# Patient Record
Sex: Male | Born: 1960 | Race: Black or African American | Hispanic: No | Marital: Single
Health system: Southern US, Community
[De-identification: ages and names within clinical notes are randomized; demographics above are authoritative.]

## PROBLEM LIST (undated history)

## (undated) DIAGNOSIS — R7989 Other specified abnormal findings of blood chemistry: Secondary | ICD-10-CM

## (undated) DIAGNOSIS — M542 Cervicalgia: Secondary | ICD-10-CM

## (undated) DIAGNOSIS — M25512 Pain in left shoulder: Secondary | ICD-10-CM

## (undated) DIAGNOSIS — D649 Anemia, unspecified: Secondary | ICD-10-CM

## (undated) DIAGNOSIS — Z720 Tobacco use: Secondary | ICD-10-CM

## (undated) DIAGNOSIS — E785 Hyperlipidemia, unspecified: Secondary | ICD-10-CM

## (undated) DIAGNOSIS — M5412 Radiculopathy, cervical region: Secondary | ICD-10-CM

## (undated) DIAGNOSIS — I10 Essential (primary) hypertension: Secondary | ICD-10-CM

## (undated) DIAGNOSIS — R609 Edema, unspecified: Secondary | ICD-10-CM

## (undated) DIAGNOSIS — F1090 Alcohol use, unspecified, uncomplicated: Secondary | ICD-10-CM

## (undated) HISTORY — DX: Anemia, unspecified: D64.9

## (undated) HISTORY — DX: Other specified abnormal findings of blood chemistry: R79.89

## (undated) HISTORY — DX: Tobacco use: Z72.0

## (undated) HISTORY — DX: Radiculopathy, cervical region: M54.12

## (undated) HISTORY — DX: Alcohol use, unspecified, uncomplicated: F10.90

## (undated) HISTORY — DX: Edema, unspecified: R60.9

## (undated) HISTORY — DX: Essential (primary) hypertension: I10

## (undated) HISTORY — DX: Pain in left shoulder: M25.512

## (undated) HISTORY — DX: Cervicalgia: M54.2

## (undated) HISTORY — DX: Hyperlipidemia, unspecified: E78.5

---

## 2003-09-07 ENCOUNTER — Ambulatory Visit (HOSPITAL_BASED_OUTPATIENT_CLINIC_OR_DEPARTMENT_OTHER): Admission: RE | Admit: 2003-09-07 | Discharge: 2003-09-07 | Payer: Self-pay | Admitting: Otolaryngology

## 2003-09-07 ENCOUNTER — Encounter (INDEPENDENT_AMBULATORY_CARE_PROVIDER_SITE_OTHER): Payer: Self-pay | Admitting: Specialist

## 2003-09-07 ENCOUNTER — Ambulatory Visit (HOSPITAL_COMMUNITY): Admission: RE | Admit: 2003-09-07 | Discharge: 2003-09-07 | Payer: Self-pay | Admitting: Otolaryngology

## 2005-01-18 ENCOUNTER — Encounter: Admission: RE | Admit: 2005-01-18 | Discharge: 2005-01-18 | Payer: Self-pay | Admitting: Occupational Medicine

## 2006-10-29 ENCOUNTER — Ambulatory Visit: Payer: Self-pay | Admitting: Cardiology

## 2006-11-09 ENCOUNTER — Ambulatory Visit: Payer: Self-pay | Admitting: Cardiology

## 2006-11-22 ENCOUNTER — Ambulatory Visit: Payer: Self-pay | Admitting: Cardiology

## 2006-12-17 ENCOUNTER — Ambulatory Visit: Payer: Self-pay | Admitting: Cardiology

## 2010-10-04 NOTE — Assessment & Plan Note (Signed)
[facility]                          [facility]   NAME:[name] [name], [name] [name]                 MRN:          [id number]  DATE:12/17/2006                            DOB:          13-Dec-1960    The patient was seen in consultation for Dr. Selinda Flavin on October 28, 2006. He had had a syncopal episode. He had been evaluated in [facility]. His 2-D echocardiogram had shown normal LV function with mild  LVH. We decided to proceed with an event recording. This study showed  some scattered PVCs but no high-grade arrhythmias. He also had a stress  Cardiolite scan. The patient exercised for 10 minutes. There were random  PVCs. There was no chest pain. There were no couplets or triplets. The  images revealed that there was no significant scar or ischemia. There  was question that the left ventricular size may be slightly increased.  The ejection fraction was 50%. However, we know by report previously  that his echocardiogram showed no marked abnormalities.   The patient is seen back today, and he is stable. He has not had any  recurrent spells. He seems to be doing well.   His blood pressure today is 130/87 with a pulse of 67. I discussed the  results with the patient. I believe his cardiac status is stable. I  encouraged him to be sure that he tried not to use any drugs including  tobacco, alcohol or cannabinoids.   PROBLEMS INCLUDE:  1. History of syncope. Etiology is not clear, but further workup is      not needed.  2. Mild hypertension. Stable.  3. Mild left ventricular hypertrophy.  4. History of polysubstance abuse.  5. Palpitations from mild premature ventricular contractions.  6. Some chest discomfort but no proven ischemia.   It is of note that we did start low-dose aspirin that I would continue.  It was mentioned at the time of my evaluation of checking his TSH and  lipids. I cannot find any records that these labs were ever  checked. I  will pass this information on to Dr. Dimas Aguas. Dr. Dimas Aguas may have some of  this information. No further followup is needed, but I will be happy to  see the patient if needed.     Luis Abed, MD, [facility]  Electronically Signed    JDK/MedQ  DD: 12/17/2006  DT: 12/18/2006  Job #: [id number]   cc:   Selinda Flavin

## 2010-10-04 NOTE — Assessment & Plan Note (Signed)
[facility]                          [facility]   NAME:[name] [name] WAAGE                 MRN:          [id number]  DATE:10/28/2006                            DOB:          07-15-1960    REASON FOR CONSULTATION:  [name] [name] is a 50 year old male with no prior  cardiac history, now referred to Dr. Willa Rough for evaluation of  recent syncope.   The patient presents on no medications and denies any documented history  of hypertension.  However, he has had documented elevated blood pressure  readings which, in fact, were as high as 164/97 during recent  presentation to the emergency room.  With respect to other cardiac risk  factors, he does admit to smoking cigars but not cigarettes, does not  know his lipid status, and denies any history of diabetes mellitus or a  family history of coronary artery disease.   The patient recently presented to the [facility] in [place], [place], with complaint of recurrent syncope.  In fact, the episodes apparently were only 15 minutes apart and it was  following the second episode that the patient became quite concerned and  went to the emergency room.  He had a negative workup, including a CT  scan of the head, but did have a urine drug screen positive for  cannabinoids.  A 2D echocardiogram showed normal left ventricular  function with mild concentric LVH and mild mitral regurgitation.   The patient was discharged with recommendation to be placed on a 48-hour  Holter monitor (records currently unavailable).  It is not clear if he  also had a TSH level.   The patient recalls having a sudden syncopal episode with no prodromal  symptoms.  This occurred while standing outside on a day where it was  not very hot, and the patient had not been drinking beer excessively.  In fact, he states that he had had only 1 beer earlier that day.  Following the second episode,  however, which was preceded by a sensation  of mild nausea and diaphoresis, he became quite concerned and sought  medical attention.  In neither case, however, did he have any chest pain  or tachy palpitations.  It is also not clear if he completely passed  out; the patient reports to me today that he caught himself prior to  hitting the ground.   Since this episode, the patient has not had any recurrent near syncope,  but has become aware of occasional tachy palpitations.  He also  describes a sensation of chest discomfort which seems to occur with  exertion, but also can occur at rest.  He denies any symptoms of reflux  disease.   Recent EKG reveals normal sinus rhythm at 76 BPM with normal axis,  occasional PVCs and LVH by voltage criteria.   ALLERGIES:  No known drug allergies.   MEDICATIONS:  None.   PAST MEDICAL HISTORY:  Nasal polyp surgery.   FAMILY HISTORY:  Negative for coronary artery disease.   SOCIAL HISTORY:  The patient is divorced, has 4 children and 1  grandchild.  He smokes 4-5 cigars a day, but does not smoke cigarettes.  He admits to drinking 1-2 beers a day and having 1-2 shots of hard  liquor a day as well.  However, he denies history of excessive or  abusive drinking.   REVIEW OF SYSTEMS:  Denies history of myocardial infarction, congestive  heart failure, hypertension, diabetes mellitus, or hyperlipidemia.  Otherwise as per HPI, remaining systems negative.   PHYSICAL EXAMINATION:  Blood pressure 148/91, pulse 73, regular, weight  173.  IN GENERAL:  A 50 year old male, sitting upright, no distress.  HEENT:  Normocephalic, atraumatic.  NECK:  Palpable bilateral carotid pulses without bruits.  No JVD.  LUNGS:  Clear to auscultation all fields.  HEART:  Regular rate and rhythm (S1, S2).  No murmurs, rubs, or gallops.  ABDOMEN:  Soft, nontender, with intact bowel sounds.  EXTREMITIES:  Palpable distal pulses without significant edema.  NEURO:  No focal  deficit.   IMPRESSION:  1. Recurrent near syncope.      a.     Normal left ventricular function by 2D echo.  2. Hypertension.      a.     Mild, concentric left ventricular hypertrophy by 2D echo.  3. Polysubstance abuse.      a.     Tobacco, alcohol, and cannabinoids.  4. Palpitations.      a.     Documented premature ventricular contractions.  5. Chest discomfort.      a.     Typical/atypical features.   PLAN:  1. Schedule exercise stress Cardiolite for risk stratification.  If      this is suggestive of ischemia, then plan is to consider further      workup with a diagnostic coronary angiogram.  2. Place the patient on a CardioNet monitor for 3 weeks to rule out      transient arrhythmias.  3. Check fasting lipids/liver profile and TSH level.  4. Start low-dose aspirin for primary prevention.  5. Schedule return clinic followup with myself and Dr. Willa Rough in      approximately 4 weeks for review of study results and further      recommendations.      [name] Searing, PA-C  Electronically Signed      [name] Abed, MD, [facility]  Electronically Signed   GS/MedQ  DD: 10/29/2006  DT: 10/29/2006  Job #: [id number]   cc:   [name] Flavin, MD

## 2010-10-07 NOTE — Op Note (Signed)
NAME:  [name] [name], [name] [name]                            ACCOUNT NO.:  [id number]   MEDICAL RECORD NO.:  [id number]                   PATIENT TYPE:  AMB   LOCATION:  [facility]                                  FACILITY:  [facility]   PHYSICIAN:  Jefry H. Pollyann Kennedy, M.D.                DATE OF BIRTH:  03-11-1961   DATE OF PROCEDURE:  09/07/2003  DATE OF DISCHARGE:                                 OPERATIVE REPORT   PREOPERATIVE DIAGNOSIS:  1. Nasal polyposis.  2. Sinus polyposis.  3. Chronic ethmoid sinusitis.  4. Chronic maxillary sinusitis.   POSTOPERATIVE DIAGNOSIS:  1. Nasal polyposis.  2. Sinus polyposis.  3. Chronic ethmoid sinusitis.  4. Chronic maxillary sinusitis.   PROCEDURE:  1. Left extensive endoscopic nasal polypectomy.  2. Left endoscopic total ethmoidectomy.  3. Left endoscopic maxillary sinusitis with removal of abnormal tissue from     maxillary sinus.   SURGEON:  Jefry H. Pollyann Kennedy, M.D.   ANESTHESIA:  General endotracheal anesthesia.   COMPLICATIONS:  None.   FINDINGS:  Large polypoid mass filling the left nasal cavity including the  choanae.  Extensive polypoid disease filling the left maxillary sinus that  was completely obstructed.  Some purulent material also within the left  maxillary sinus.  Chronic ethmoid sinus disease with inflamed and polypoid  degeneration of the mucosa.  Very firm, dense bone in the ethmoid and the  posterior medial turbinate attachment.   HISTORY:  This is a 50 year old gentleman with a multi-year history of  difficulty breathing through the left side of his nose.  Radiographic  imaging revealed extensive soft tissue mass with some bony remodeling in the  left nasal cavity and the left maxillary sinus as well as the infundibulum.  He did not respond to medical therapy.  The risks, benefits, alternatives,  and complications of the procedure were explained to the patient and he  seemed to understand and agreed to the surgery.   PROCEDURE:  The patient  was taken to the operating room and placed on the  operating table in supine position.  Following induction of general  endotracheal anesthesia, the patient was prepped and draped in a standard  fashion.  Oxymetazoline spray was used preoperatively in the nasal cavities.  1% Xylocaine with epinephrine was infiltrated into the polypoid mass and  eventually into the attachments of the middle turbinates.  Afrin soaked  pledgets were used periodically during the procedure.   1. Extensive left endoscopic nasal polypectomy.  The 0 degree nasal     endoscope and the microdebrider were used to carefully removed the     extensive polypoid disease that was present within the nasal cavities to     open up the nasal cavity to be able to visualize the middle turbinate and     other structures that are important for sinus surgery.   1. Left endoscopic total ethmoidectomy.  After the  nasal polypectomy was     performed, the dissection was continued into the ethmoid cavity.  The     uncinate process was severely lateralized and was resected using through     cut forceps.  The bullae was obliterated by polypoid tissue and a     complete ethmoidectomy dissection was accomplished.  The roof of the     ethmoid was severely, dense hard bone and attempts were not made to open     into any superior cells which actually were clear on the imaging studies.     The lateral limit of dissection was the lamina papyracea which was kept     intact.  The posterior attachment of the middle turbinate was kept intact     except for inferiorly where it was much thinner and removed and this     allowed access into the posterior cells.   1. Left endoscopic maxillary antrostomy.  The natural ostium of the     maxillary sinus was identified with the suction and 30 degree scope.     Purulent secretions and a large mass of polypoid tissue was found within     the sinus.  Some separate biopsies were taken from the schneiderian      region and were sent for frozen section evaluation which revealed no     definite evidence of inverting papilloma.  Polypoid dense fibrotic tissue     was removed from the maxillary sinus using upbiting and giraffe forceps.     The majority of the abnormal tissue was removed.  The antrostomy was     opened wide using back biting forceps and through cut forceps.  The     maxillary sinus was packed with Cortisporin ointment.  The bipolar     cautery was used to provide hemostasis in a branch of the sphenoethmoid     artery posteriorly.  The cavities were suctioned of blood, secretions,     and abnormal tissue.  A Kennedy pack was used to pack the ethmoid sinus     and a rolled Telfa packing was placed in the left nasal cavity.  The     pharynx was suctioned of blood and secretions.  The patient was then     awakened, extubated, and transferred to the recovery room.                                               Jefry H. Pollyann Kennedy, M.D.    JHR/MEDQ  D:  09/07/2003  T:  09/07/2003  Job:  [id number]

## 2012-03-13 ENCOUNTER — Other Ambulatory Visit: Payer: Self-pay | Admitting: Family Medicine

## 2012-03-13 ENCOUNTER — Ambulatory Visit
Admission: RE | Admit: 2012-03-13 | Discharge: 2012-03-13 | Disposition: A | Discharge status: Home / Self Care | Payer: No Typology Code available for payment source | Source: Ambulatory Visit | Attending: Family Medicine | Admitting: Family Medicine

## 2012-03-13 DIAGNOSIS — Z0000 Encounter for general adult medical examination without abnormal findings: Secondary | ICD-10-CM

## 2018-06-20 DIAGNOSIS — R5383 Other fatigue: Secondary | ICD-10-CM | POA: Diagnosis not present

## 2018-06-20 DIAGNOSIS — R030 Elevated blood-pressure reading, without diagnosis of hypertension: Secondary | ICD-10-CM | POA: Diagnosis not present

## 2018-06-20 DIAGNOSIS — Z13220 Encounter for screening for lipoid disorders: Secondary | ICD-10-CM | POA: Diagnosis not present

## 2018-06-20 DIAGNOSIS — I10 Essential (primary) hypertension: Secondary | ICD-10-CM | POA: Diagnosis not present

## 2018-06-25 DIAGNOSIS — Z6821 Body mass index (BMI) 21.0-21.9, adult: Secondary | ICD-10-CM | POA: Diagnosis not present

## 2018-06-25 DIAGNOSIS — Z0000 Encounter for general adult medical examination without abnormal findings: Secondary | ICD-10-CM | POA: Diagnosis not present

## 2018-08-07 DIAGNOSIS — Z1211 Encounter for screening for malignant neoplasm of colon: Secondary | ICD-10-CM | POA: Diagnosis not present

## 2018-08-07 DIAGNOSIS — Z6822 Body mass index (BMI) 22.0-22.9, adult: Secondary | ICD-10-CM | POA: Diagnosis not present

## 2018-10-01 DIAGNOSIS — Z1159 Encounter for screening for other viral diseases: Secondary | ICD-10-CM | POA: Diagnosis not present

## 2018-10-03 DIAGNOSIS — F17290 Nicotine dependence, other tobacco product, uncomplicated: Secondary | ICD-10-CM | POA: Diagnosis not present

## 2018-10-03 DIAGNOSIS — K635 Polyp of colon: Secondary | ICD-10-CM | POA: Diagnosis not present

## 2018-10-03 DIAGNOSIS — I10 Essential (primary) hypertension: Secondary | ICD-10-CM | POA: Diagnosis not present

## 2018-10-03 DIAGNOSIS — Z1211 Encounter for screening for malignant neoplasm of colon: Secondary | ICD-10-CM | POA: Diagnosis not present

## 2018-10-03 DIAGNOSIS — D128 Benign neoplasm of rectum: Secondary | ICD-10-CM | POA: Diagnosis not present

## 2018-11-18 DIAGNOSIS — D126 Benign neoplasm of colon, unspecified: Secondary | ICD-10-CM | POA: Diagnosis not present

## 2019-01-29 DIAGNOSIS — M7702 Medial epicondylitis, left elbow: Secondary | ICD-10-CM | POA: Diagnosis not present

## 2019-01-29 DIAGNOSIS — I10 Essential (primary) hypertension: Secondary | ICD-10-CM | POA: Diagnosis not present

## 2019-01-29 DIAGNOSIS — Z6821 Body mass index (BMI) 21.0-21.9, adult: Secondary | ICD-10-CM | POA: Diagnosis not present

## 2019-02-12 DIAGNOSIS — I10 Essential (primary) hypertension: Secondary | ICD-10-CM | POA: Diagnosis not present

## 2019-02-12 DIAGNOSIS — Z6821 Body mass index (BMI) 21.0-21.9, adult: Secondary | ICD-10-CM | POA: Diagnosis not present

## 2019-05-14 DIAGNOSIS — Z20828 Contact with and (suspected) exposure to other viral communicable diseases: Secondary | ICD-10-CM | POA: Diagnosis not present

## 2019-05-19 DIAGNOSIS — Z20828 Contact with and (suspected) exposure to other viral communicable diseases: Secondary | ICD-10-CM | POA: Diagnosis not present

## 2019-05-30 DIAGNOSIS — Z6821 Body mass index (BMI) 21.0-21.9, adult: Secondary | ICD-10-CM | POA: Diagnosis not present

## 2019-05-30 DIAGNOSIS — I10 Essential (primary) hypertension: Secondary | ICD-10-CM | POA: Diagnosis not present

## 2019-05-30 DIAGNOSIS — M25512 Pain in left shoulder: Secondary | ICD-10-CM | POA: Diagnosis not present

## 2019-07-01 DIAGNOSIS — M25512 Pain in left shoulder: Secondary | ICD-10-CM | POA: Diagnosis not present

## 2019-07-04 DIAGNOSIS — M25512 Pain in left shoulder: Secondary | ICD-10-CM | POA: Diagnosis not present

## 2019-07-11 DIAGNOSIS — M25512 Pain in left shoulder: Secondary | ICD-10-CM | POA: Diagnosis not present

## 2019-07-15 DIAGNOSIS — M25512 Pain in left shoulder: Secondary | ICD-10-CM | POA: Diagnosis not present

## 2019-07-15 DIAGNOSIS — Z6821 Body mass index (BMI) 21.0-21.9, adult: Secondary | ICD-10-CM | POA: Diagnosis not present

## 2019-07-22 DIAGNOSIS — M7582 Other shoulder lesions, left shoulder: Secondary | ICD-10-CM | POA: Diagnosis not present

## 2019-07-22 DIAGNOSIS — M25512 Pain in left shoulder: Secondary | ICD-10-CM | POA: Diagnosis not present

## 2019-08-01 DIAGNOSIS — M25512 Pain in left shoulder: Secondary | ICD-10-CM | POA: Diagnosis not present

## 2019-08-19 ENCOUNTER — Ambulatory Visit: Payer: Self-pay | Attending: Internal Medicine

## 2019-08-19 DIAGNOSIS — Z23 Encounter for immunization: Secondary | ICD-10-CM

## 2019-08-19 NOTE — Progress Notes (Signed)
   Covid-19 Vaccination Clinic  Name:  Anothy Bufano    MRN: [id number] DOB: 11-Dec-1960  08/19/2019  Mr. Goates was observed post Covid-19 immunization for 15 minutes without incident. He was provided with Vaccine Information Sheet and instruction to access the V-Safe system.   Mr. Phegley was instructed to call 911 with any severe reactions post vaccine: Marland Kitchen Difficulty breathing  . Swelling of face and throat  . A fast heartbeat  . A bad rash all over body  . Dizziness and weakness   Immunizations Administered    Name Date Dose VIS Date Route   Moderna COVID-19 Vaccine 08/19/2019  2:00 PM 0.5 mL 04/22/2019 Intramuscular   Manufacturer: Moderna   Lot: [id number]   NDC: 27078-675-44

## 2019-09-09 ENCOUNTER — Other Ambulatory Visit: Payer: Self-pay

## 2019-09-09 ENCOUNTER — Ambulatory Visit (INDEPENDENT_AMBULATORY_CARE_PROVIDER_SITE_OTHER): Payer: BC Managed Care – PPO | Admitting: Neurology

## 2019-09-09 DIAGNOSIS — M5412 Radiculopathy, cervical region: Secondary | ICD-10-CM

## 2019-09-09 DIAGNOSIS — R202 Paresthesia of skin: Secondary | ICD-10-CM

## 2019-09-09 DIAGNOSIS — G5602 Carpal tunnel syndrome, left upper limb: Secondary | ICD-10-CM

## 2019-09-09 NOTE — Procedures (Signed)
[facility]  [address], [address]  [place], [place] [address] Tel: [phone] Fax:  [phone] Test Date:  09/09/2019  Patient: [name] [name] DOB: Dec 06, 1960 Physician: Nita Sickle, DO  Sex: Male Height: 5\' 11"  Ref Phys: , MD  ID#: Burnell Blanks Temp: 35.0C Technician:    Patient Complaints: This is a 59 year-old man referred for evaluation of left arm paresthesias.  NCV & EMG Findings: Extensive electrodiagnostic testing of the left upper extremity shows: 1. Left median sensory response shows prolonged latency (L4.2 ms).  Left ulnar and radial sensory responses are with normal limits. 2. Left median and ulnar motor responses are within normal limits. 3. Chronic motor axon loss changes are seen in the left biceps, deltoid, and infraspinatus muscles with active fibrillation potentials.   Impression: 1. 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 2. Left median neuropathy at or distal to the wrist (mild), consistent with a clinical diagnosis of carpal tunnel syndrome.     ___________________________ 46, DO    Nerve Conduction Studies Anti Sensory Summary Table   Stim Site NR Peak (ms) Norm Peak (ms) P-T Amp (V) Norm P-T Amp  Left Median Anti Sensory (2nd Digit)  35C  Wrist    4.2 <3.6 21.7 >15  Left Radial Anti Sensory (Base 1st Digit)  33C  Wrist    2.6 <2.7 26.7 >14  Left Ulnar Anti Sensory (5th Digit)  33C  Wrist    3.1 <3.1 12.2 >10   Motor Summary Table   Stim Site NR Onset (ms) Norm Onset (ms) O-P Amp (mV) Norm O-P Amp Site1 Site2 Delta-0 (ms) Dist (cm) Vel (m/s) Norm Vel (m/s)  Left Median Motor (Abd Poll Brev)  33C  Wrist    3.8 <4.0 6.6 >6 Elbow Wrist 5.6 32.0 57 >50  Elbow    9.4  6.1         Left Ulnar Motor (Abd Dig Minimi)  33C  Wrist    2.4 <3.1 12.7 >7 B Elbow Wrist 4.9 28.0 57 >50  B Elbow    7.3  11.3  A Elbow B Elbow 1.8 10.0 56 >50  A Elbow    9.1  10.4           EMG   Side Muscle Ins Act Fibs Psw Fasc Number Recrt Dur Dur. Amp Amp. Poly Poly. Comment  Left 1stDorInt Nml Nml Nml Nml Nml Nml Nml Nml Nml Nml Nml Nml N/A  Left Abd Poll Brev Nml Nml Nml Nml Nml Nml Nml Nml Nml Nml Nml Nml N/A  Left PronatorTeres Nml Nml Nml Nml Nml Nml Nml Nml Nml Nml Nml Nml N/A  Left Biceps Nml 2+ Nml Nml 3- Rapid Most 1+ Most 1+ Most 1+ ATR  Left Triceps Nml Nml Nml Nml Nml Nml Nml Nml Nml Nml Nml Nml N/A  Left Deltoid Nml 2+ Nml Nml SMU Rapid Most 1+ Most 1+ Most 1+ ATR  Left Cervical Parasp Low Nml Nml Nml Nml Nml Nml Nml Nml Nml Nml Nml Nml N/A  Left Infraspinatus Nml 2+ Nml Nml SMU Rapid All 1+ All 1+ All 1+ ATR      Waveforms:

## 2019-09-19 DIAGNOSIS — M25512 Pain in left shoulder: Secondary | ICD-10-CM | POA: Diagnosis not present

## 2019-10-02 ENCOUNTER — Ambulatory Visit: Payer: BC Managed Care – PPO | Attending: Internal Medicine

## 2019-10-02 DIAGNOSIS — Z23 Encounter for immunization: Secondary | ICD-10-CM

## 2019-10-02 NOTE — Progress Notes (Signed)
   Covid-19 Vaccination Clinic  Name:  [name] [name]    MRN: [id number] DOB: [birth year]  10/02/2019  [name] was observed post Covid-19 immunization for 15 minutes without incident. He was provided with Vaccine Information Sheet and instruction to access the V-Safe system.   [name] was instructed to call 911 with any severe reactions post vaccine: Marland Kitchen Difficulty breathing  . Swelling of face and throat  . A fast heartbeat  . A bad rash all over body  . Dizziness and weakness   Immunizations Administered    Name Date Dose VIS Date Route   Moderna COVID-19 Vaccine 10/02/2019  9:41 AM 0.5 mL 04/2019 Intramuscular   Manufacturer: Moderna   Lot: [id number]   NDC: 89842-103-12

## 2019-10-03 DIAGNOSIS — M4802 Spinal stenosis, cervical region: Secondary | ICD-10-CM | POA: Diagnosis not present

## 2019-10-03 DIAGNOSIS — M40202 Unspecified kyphosis, cervical region: Secondary | ICD-10-CM | POA: Diagnosis not present

## 2019-10-03 DIAGNOSIS — M47812 Spondylosis without myelopathy or radiculopathy, cervical region: Secondary | ICD-10-CM | POA: Diagnosis not present

## 2019-10-03 DIAGNOSIS — M542 Cervicalgia: Secondary | ICD-10-CM | POA: Diagnosis not present

## 2019-10-10 DIAGNOSIS — M25512 Pain in left shoulder: Secondary | ICD-10-CM | POA: Diagnosis not present

## 2019-11-12 DIAGNOSIS — M7552 Bursitis of left shoulder: Secondary | ICD-10-CM | POA: Diagnosis not present

## 2019-11-12 DIAGNOSIS — G8918 Other acute postprocedural pain: Secondary | ICD-10-CM | POA: Diagnosis not present

## 2019-11-12 DIAGNOSIS — S46002A Unspecified injury of muscle(s) and tendon(s) of the rotator cuff of left shoulder, initial encounter: Secondary | ICD-10-CM | POA: Diagnosis not present

## 2019-11-12 DIAGNOSIS — M7542 Impingement syndrome of left shoulder: Secondary | ICD-10-CM | POA: Diagnosis not present

## 2019-11-12 DIAGNOSIS — M24112 Other articular cartilage disorders, left shoulder: Secondary | ICD-10-CM | POA: Diagnosis not present

## 2019-11-12 DIAGNOSIS — M19012 Primary osteoarthritis, left shoulder: Secondary | ICD-10-CM | POA: Diagnosis not present

## 2019-11-12 DIAGNOSIS — G5602 Carpal tunnel syndrome, left upper limb: Secondary | ICD-10-CM | POA: Diagnosis not present

## 2019-11-28 DIAGNOSIS — M25512 Pain in left shoulder: Secondary | ICD-10-CM | POA: Diagnosis not present

## 2019-12-01 DIAGNOSIS — M25712 Osteophyte, left shoulder: Secondary | ICD-10-CM | POA: Diagnosis not present

## 2019-12-04 DIAGNOSIS — M25712 Osteophyte, left shoulder: Secondary | ICD-10-CM | POA: Diagnosis not present

## 2019-12-08 DIAGNOSIS — M25712 Osteophyte, left shoulder: Secondary | ICD-10-CM | POA: Diagnosis not present

## 2019-12-11 DIAGNOSIS — M25712 Osteophyte, left shoulder: Secondary | ICD-10-CM | POA: Diagnosis not present

## 2019-12-16 DIAGNOSIS — M25712 Osteophyte, left shoulder: Secondary | ICD-10-CM | POA: Diagnosis not present

## 2019-12-19 DIAGNOSIS — M25712 Osteophyte, left shoulder: Secondary | ICD-10-CM | POA: Diagnosis not present

## 2019-12-23 DIAGNOSIS — M25712 Osteophyte, left shoulder: Secondary | ICD-10-CM | POA: Diagnosis not present

## 2019-12-25 DIAGNOSIS — M25712 Osteophyte, left shoulder: Secondary | ICD-10-CM | POA: Diagnosis not present

## 2019-12-30 DIAGNOSIS — M25712 Osteophyte, left shoulder: Secondary | ICD-10-CM | POA: Diagnosis not present

## 2020-01-01 DIAGNOSIS — M25712 Osteophyte, left shoulder: Secondary | ICD-10-CM | POA: Diagnosis not present

## 2020-01-07 DIAGNOSIS — M25712 Osteophyte, left shoulder: Secondary | ICD-10-CM | POA: Diagnosis not present

## 2020-01-09 DIAGNOSIS — M25712 Osteophyte, left shoulder: Secondary | ICD-10-CM | POA: Diagnosis not present

## 2020-01-12 DIAGNOSIS — M25712 Osteophyte, left shoulder: Secondary | ICD-10-CM | POA: Diagnosis not present

## 2020-01-14 DIAGNOSIS — M25712 Osteophyte, left shoulder: Secondary | ICD-10-CM | POA: Diagnosis not present

## 2020-01-19 DIAGNOSIS — M25712 Osteophyte, left shoulder: Secondary | ICD-10-CM | POA: Diagnosis not present

## 2020-01-22 DIAGNOSIS — M25712 Osteophyte, left shoulder: Secondary | ICD-10-CM | POA: Diagnosis not present

## 2020-01-27 DIAGNOSIS — M25712 Osteophyte, left shoulder: Secondary | ICD-10-CM | POA: Diagnosis not present

## 2020-01-29 DIAGNOSIS — M25712 Osteophyte, left shoulder: Secondary | ICD-10-CM | POA: Diagnosis not present

## 2020-02-03 DIAGNOSIS — M25712 Osteophyte, left shoulder: Secondary | ICD-10-CM | POA: Diagnosis not present

## 2020-02-05 DIAGNOSIS — M25712 Osteophyte, left shoulder: Secondary | ICD-10-CM | POA: Diagnosis not present

## 2020-02-10 DIAGNOSIS — M25712 Osteophyte, left shoulder: Secondary | ICD-10-CM | POA: Diagnosis not present

## 2020-02-11 DIAGNOSIS — M25712 Osteophyte, left shoulder: Secondary | ICD-10-CM | POA: Diagnosis not present

## 2021-09-20 ENCOUNTER — Other Ambulatory Visit: Payer: Self-pay | Admitting: Orthopedic Surgery

## 2021-09-20 ENCOUNTER — Other Ambulatory Visit (HOSPITAL_COMMUNITY): Payer: Self-pay | Admitting: Orthopedic Surgery

## 2021-09-20 DIAGNOSIS — M25512 Pain in left shoulder: Secondary | ICD-10-CM

## 2021-10-05 ENCOUNTER — Ambulatory Visit (HOSPITAL_COMMUNITY)
Admission: RE | Admit: 2021-10-05 | Discharge: 2021-10-05 | Disposition: A | Discharge status: Home / Self Care | Payer: BC Managed Care – PPO | Source: Ambulatory Visit | Attending: Orthopedic Surgery | Admitting: Orthopedic Surgery

## 2021-10-05 DIAGNOSIS — M25512 Pain in left shoulder: Secondary | ICD-10-CM | POA: Diagnosis present

## 2021-12-20 ENCOUNTER — Other Ambulatory Visit: Payer: Self-pay | Admitting: Neurosurgery

## 2022-01-05 ENCOUNTER — Other Ambulatory Visit (HOSPITAL_COMMUNITY): Payer: BC Managed Care – PPO

## 2022-01-12 ENCOUNTER — Ambulatory Visit: Admit: 2022-01-12 | Payer: BC Managed Care – PPO

## 2022-01-12 SURGERY — ANTERIOR CERVICAL DECOMPRESSION/DISCECTOMY FUSION 4 LEVELS
Anesthesia: General

## 2023-03-18 IMAGING — MR MR SHOULDER*L* W/O CM
6 series · 40 of 40 positions shown · non-contrast
Comparison: None Available.

CLINICAL DATA: Left shoulder pain.

EXAM:
MRI OF THE LEFT SHOULDER WITHOUT CONTRAST
TECHNIQUE: Multiplanar, multisequence MR imaging of the shoulder was performed.
No intravenous contrast was administered.

[Series 5: T2 fat-sat · axial · left · 4.0mm · 0.47mm/px · z∈[-96,+22]mm · 8 of 26 slices shown (1 of 4)]
[im 1/26]
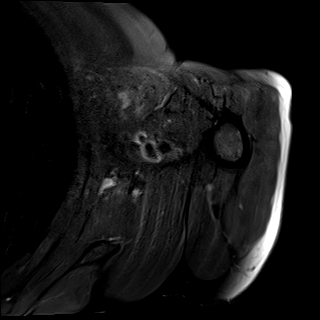
[im 4/26]
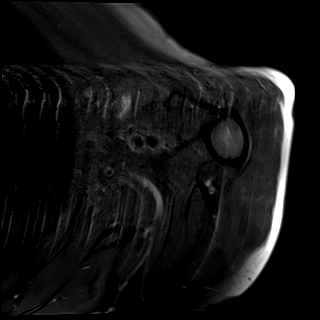
[im 8/26]
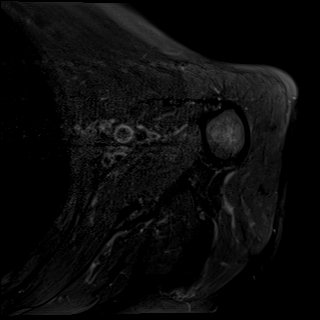
[im 11/26]
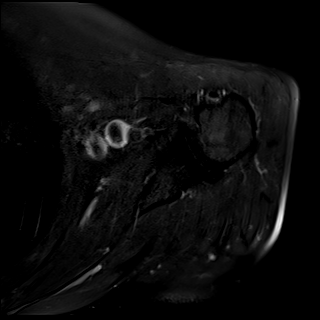
[im 15/26]
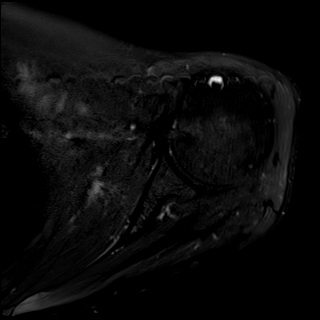
[im 18/26]
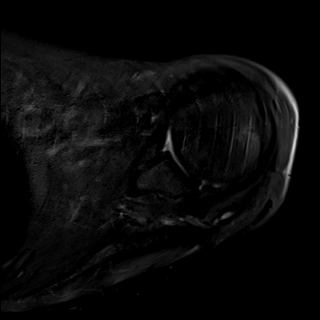
[im 22/26]
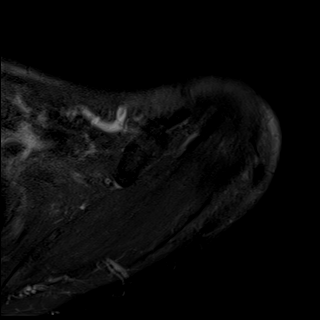
[im 26/26]
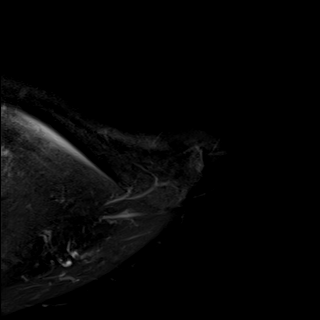

[Series 6: T2 fat-sat · oblique · left · 4.0mm · 0.47mm/px · 7 of 26 slices shown (2 of 4)]
[im 1/26]
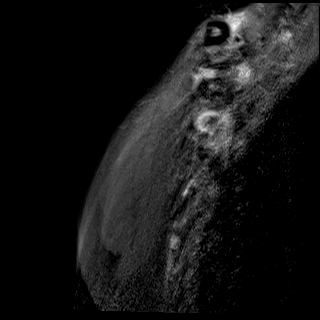
[im 5/26]
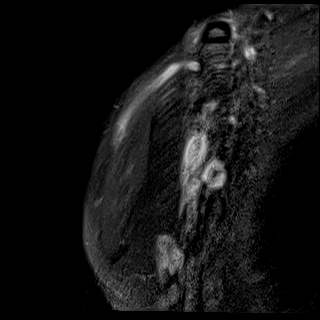
[im 9/26]
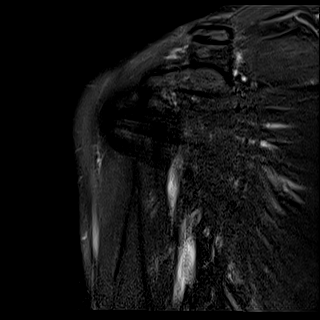
[im 13/26]
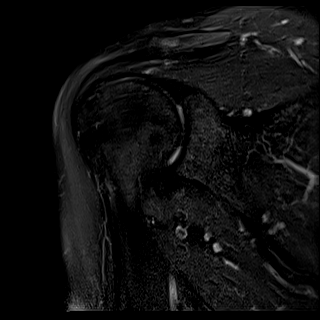
[im 17/26]
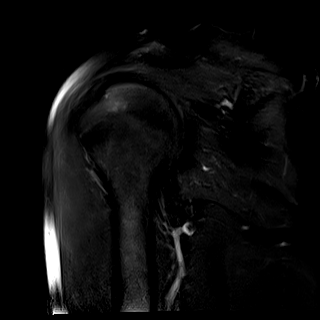
[im 21/26]
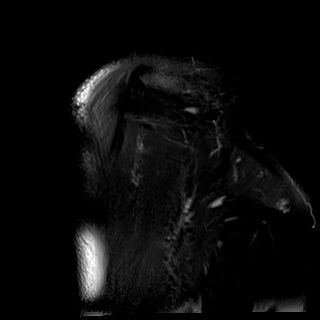
[im 26/26]
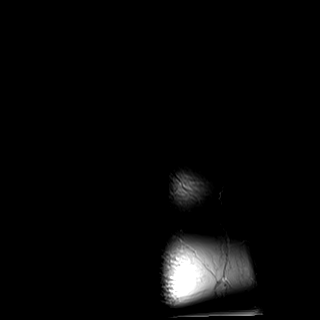

[Series 7: PD · oblique · left · 4.0mm · 0.47mm/px · 7 of 26 slices shown]
[im 1/26]
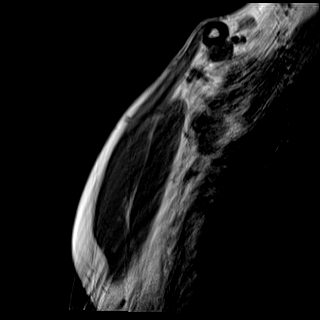
[im 5/26]
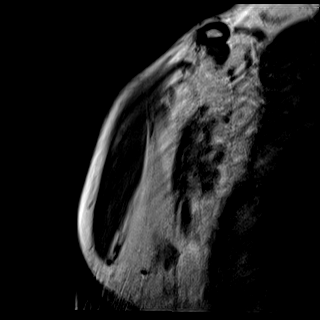
[im 9/26]
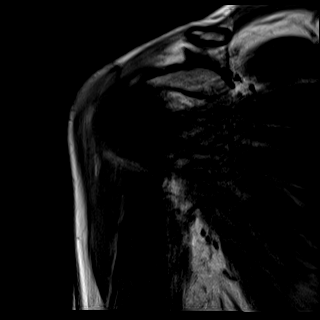
[im 13/26]
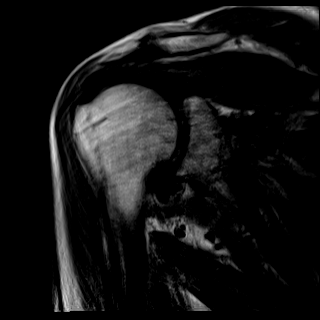
[im 17/26]
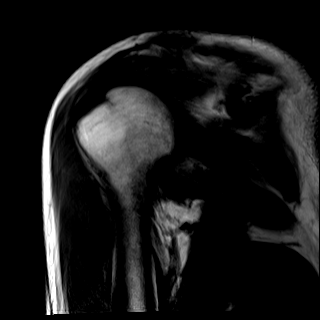
[im 21/26]
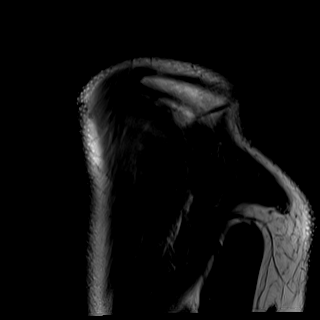
[im 26/26]
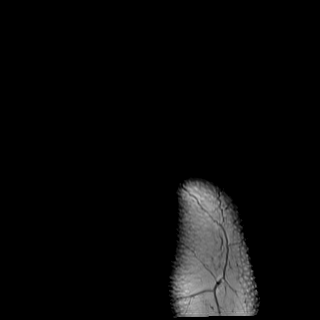

[Series 8: T2 fat-sat · oblique · left · 4.0mm · 0.44mm/px · 6 of 23 slices shown (3 of 4)]
[im 1/23]
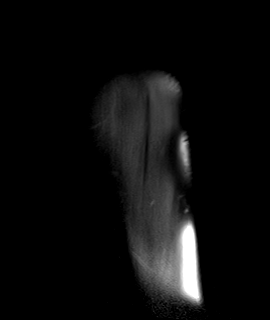
[im 5/23]
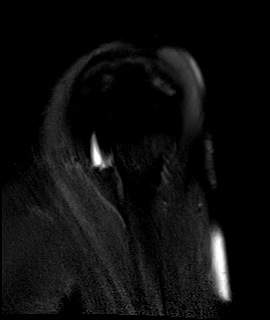
[im 9/23]
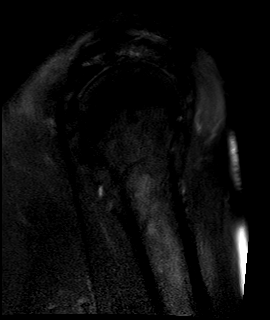
[im 14/23]
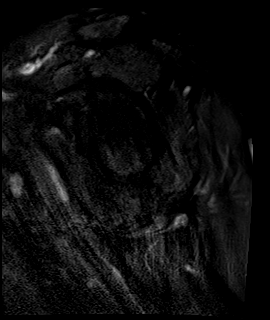
[im 18/23]
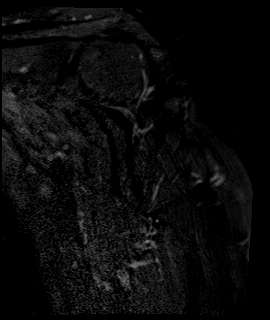
[im 23/23]
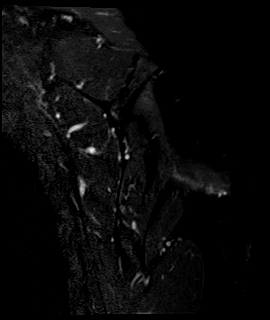

[Series 9: T1 · oblique · left · 4.0mm · 0.41mm/px · 6 of 23 slices shown]
[im 1/23]
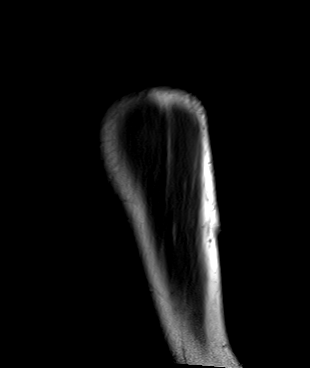
[im 5/23]
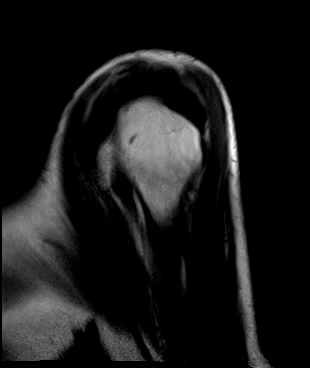
[im 9/23]
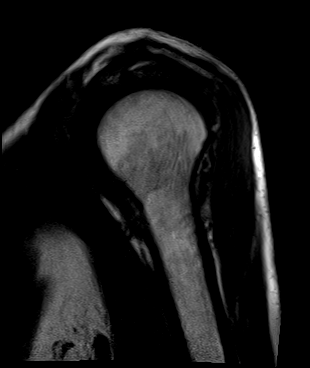
[im 14/23]
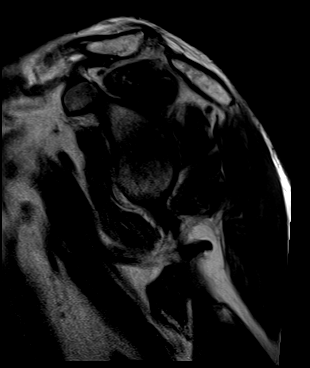
[im 18/23]
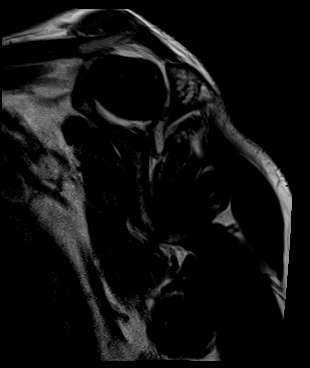
[im 23/23]
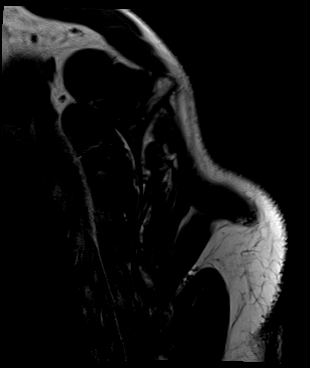

[Series 10: T2 fat-sat · oblique · left · 4.0mm · 0.44mm/px · 6 of 23 slices shown (4 of 4)]
[im 1/23]
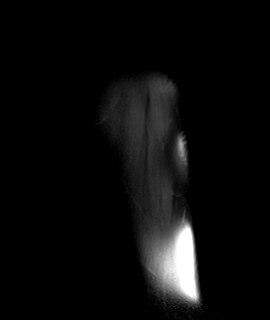
[im 5/23]
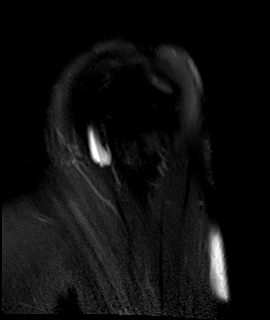
[im 9/23]
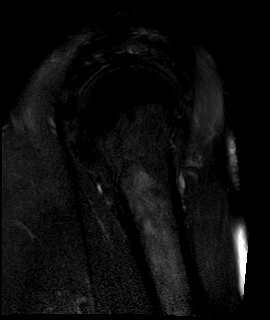
[im 14/23]
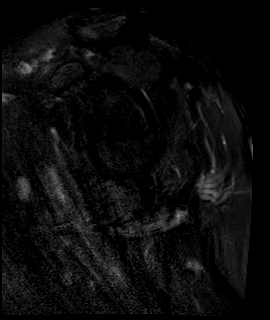
[im 18/23]
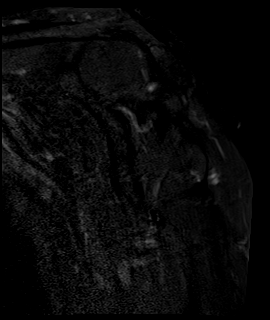
[im 23/23]
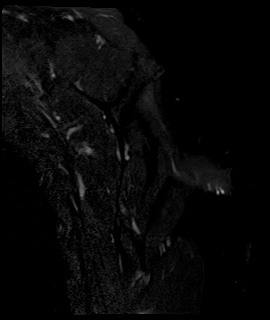

[40 of 40 positions shown; findings below may reference images not displayed]

FINDINGS: Patient motion degrades image quality limiting evaluation.

Rotator cuff: Moderate tendinosis of the supraspinatus and
infraspinatus tendons. Teres minor tendon is intact. Subscapularis
tendon is intact.

Muscles: No muscle atrophy or edema. No intramuscular fluid
collection or hematoma.

Biceps Long Head: Mild tendinosis of the intra-articular portion of
the long head of the biceps tendon.

Acromioclavicular Joint: Moderate arthropathy of the
acromioclavicular joint. No subacromial/subdeltoid bursal fluid.

Glenohumeral Joint: No joint effusion. No chondral defect.

Labrum: Grossly intact, but evaluation is limited by lack of
intraarticular fluid/contrast.

Bones: No fracture or dislocation. No aggressive osseous lesion.

Other: No fluid collection or hematoma.
IMPRESSION: 1. Moderate tendinosis of the supraspinatus and infraspinatus
tendons.
2. Mild tendinosis of the intra-articular portion of the long head
of the biceps tendon.

## 2024-01-06 NOTE — ED Provider Notes (Signed)
 Emergency Department Provider Note    ED Clinical Impression   Final diagnoses:  Fall, initial encounter (Primary)  Alcohol use disorder  Poorly-controlled hypertension    ED Assessment/Plan    Condition: Stable Disposition: Discharge  This chart has been completed using Dragon Medical Dictation software, and while attempts have been made to ensure accuracy, certain words and phrases may not be transcribed as intended.   History   Chief Complaint  Patient presents with  . Fall   HPI  [name] [name] is a 63 y.o. male  who presents today to the  emergency department complaining of a fall that occurred today.  Patient has been drinking and is intoxicated.  He states he drinks almost every day.  He states he did not quite fall but felt weak and so he even fell to the ground.  Denies any head injury describes generalized weakness.  Also complains of chronic pain in his feet.  No obvious injury.  Patient has a history of hypertension but has been noncompliant with his medication.    Allergies: has no known allergies. Medications: has a current medication list which includes the following long-term medication(s): furosemide, losartan, and amlodipine. PMHx:  has a past medical history of CHF (congestive heart failure) and Hypertension. PSHx:  has a past surgical history that includes Hip surgery; Nasall surgery; and Colonoscopy. SocHx:  reports that he has been smoking cigars. He has never used smokeless tobacco. He reports current alcohol use. He reports that he does not use drugs. Allergies, Medications, Medical, Surgical, and Social History were reviewed as documented above.   Social Drivers of Health with Concerns   Tobacco Use: High Risk (01/06/2024)   Patient History   . Smoking Tobacco Use: Every Day   . Smokeless Tobacco Use: Never   . Passive Exposure: Not on file  Alcohol Use:  Alcohol Misuse (06/13/2022)   Received from [facility]   AUDIT-C   . Q1: How often do you have a drink containing alcohol?: 4 or more times a week   . Q2: How many drinks containing alcohol do you have on a typical day when you are drinking?: 3 or 4   . Q3: How often do you have six or more drinks on one occasion?: Never  Physical Activity: Insufficiently Active (06/13/2022)   Received from [facility]   Exercise Vital Sign   . On average, how many days per week do you engage in moderate to strenuous exercise (like a brisk walk)?: 6 days   . On average, how many minutes do you engage in exercise at this level?: 20 min  Interpersonal Safety: Not on file  Substance Use: Not on file (01/06/2024)  Financial Resource Strain: High Risk (06/13/2022)   Received from [facility]   Overall Financial Resource Strain (CARDIA)   . Difficulty of Paying Living Expenses: Very hard  Health Literacy: Not on file  Internet Connectivity: Not on file     Review Of Systems  Review of Systems  Constitutional:  Negative for fever.  HENT:  Negative for congestion.   Respiratory:  Negative for chest tightness and shortness of breath.   Cardiovascular:  Negative for chest pain.  Gastrointestinal:  Negative for abdominal pain.  Musculoskeletal:        Foot pain.  Skin:  Negative for color change.  Neurological:  Positive for weakness.  Psychiatric/Behavioral:  Negative for behavioral problems.   All other systems reviewed and are negative.   Physical Exam   BP 160/110   Pulse 84   Temp 36.4 C (97.5 F) (Oral)   Resp 16   Ht 180.3 cm (5' 11)   Wt 71.5 kg (157 lb 11.2 oz)   SpO2 100%   BMI 21.99 kg/m   Physical Exam Vitals and nursing note reviewed.  Constitutional:      General: He is not in acute distress. HENT:     Head: Normocephalic.  Eyes:     Conjunctiva/sclera: Conjunctivae normal.  Cardiovascular:     Rate and Rhythm: Regular rhythm.     Pulses: Normal pulses.     Heart  sounds: Normal heart sounds.  Pulmonary:     Effort: No respiratory distress.     Breath sounds: Normal breath sounds.  Abdominal:     General: Bowel sounds are normal. There is no distension.     Palpations: Abdomen is soft.     Tenderness: There is no abdominal tenderness. There is no right CVA tenderness, left CVA tenderness, guarding or rebound.     Comments: Abdomen soft, nontender, normoactive bowel sounds.  Musculoskeletal:        General: No deformity.     Comments: Trace bilateral lower extremity pitting peripheral edema.  No overt tenderness of the feet.  Normal distal neurovascular exam of both lower extremities.  Skin:    General: Skin is warm.     Capillary Refill: Capillary refill takes 2 to 3 seconds.     Comments: Normal cap refill.  Chronic appearing venous status dermatitis/dry scaly rash on both lower extremities bilaterally.  Neurological:     General: No focal deficit present.     Mental Status: He is oriented to person, place, and time.     Cranial Nerves: No cranial nerve deficit.     Sensory: No sensory deficit.     Comments: There is no motor, sensory or cerebellar deficits.  Nonfocal neurologic exam.  GCS is 15.  Cranial nerves grossly intact.  NIH stroke scale is 0.  Psychiatric:        Mood and Affect: Mood normal.     ED Course  Medical Decision Making Clinical picture suggest alcohol use disorder.  Given complaints of weakness, will do basic workup including head CT and labs.    12:47 AM Patient is doing well.  Patient has family in the room will take him home.  Labs and imaging reviewed and discussed.  Counseled him to follow-up with [facility] for help with his alcohol use disorder.  Also counseled him to take his blood pressure medication as prescribed by his doctor.  Patient stable for discharge.  I have reviewed my clinical findings and studies and my clinical impression with the patient and family here present, with the patient's permission as  applicable. The patient and family have expressed understanding that at this time there is no evidence for a more malignant underlying process, but the patient and family also understand that early in the process of a condition such as this, an initial workup can be falsely reassuring. I have counseled the patient and discussed follow-up with the patient and family, stressing the importance of appropriate follow-up. I have also counseled the patient to return if worse or any concerns. Routine discharge counseling was given to the patient and the patient and family  understand that worsening, changing or persistent symptoms should prompt an immediate call or follow up with their primary physician or return to the emergency department for reevaluation. Patient and family have expressed understanding.   Problems Addressed: Alcohol use disorder: acute illness or injury that poses a threat to life or bodily functions Fall, initial encounter: acute illness or injury Poorly-controlled hypertension: acute illness or injury that poses a threat to life or bodily functions  Amount and/or Complexity of Data Reviewed Independent Historian: EMS Labs: ordered. Decision-making details documented in ED Course. Radiology: ordered. Decision-making details documented in ED Course.  Risk Prescription drug management. Decision regarding hospitalization.     Procedures   No results found for this visit on 01/06/24 (from the past 4464 hours).   ED Results Results for orders placed or performed during the hospital encounter of 01/06/24  Comprehensive Metabolic Panel  Result Value Ref Range   Sodium 142 135 - 145 mmol/L   Potassium 3.9 3.5 - 5.0 mmol/L   Chloride 105 98 - 107 mmol/L   CO2 25.5 21.0 - 32.0 mmol/L   Anion Gap 12 (H) 3 - 11 mmol/L   BUN 9 8 - 20 mg/dL   Creatinine 9.02 9.19 - 1.30 mg/dL   BUN/Creatinine Ratio 9    eGFR CKD-EPI (2021) Male 88 >=60 mL/min/1.61m2   Glucose 95 70 - 179 mg/dL    Calcium 8.5 8.5 - 89.8 mg/dL   Albumin 2.7 (L) 3.5 - 5.0 g/dL   Total Protein 7.9 6.0 - 8.0 g/dL   Total Bilirubin 0.4 0.3 - 1.2 mg/dL   AST 49 (H) 15 - 40 U/L   ALT 26 12 - 78 U/L   Alkaline Phosphatase 82 46 - 116 U/L  Magnesium  Result Value Ref Range   Magnesium 1.6 1.6 - 2.6 mg/dL  Ethanol  Result Value Ref Range   Alcohol, Ethyl 315 (HH) <=10 mg/dL  CBC w/ Differential  Result Value Ref Range   WBC 6.5 4.0 - 10.5 10*9/L   RBC 3.94 (L) 4.10 - 5.60 10*12/L   HGB 10.7 (L) 12.5 - 17.0 g/dL   HCT 67.5 (L) 63.9 - 49.9 %   MCV 82.2 80.0 - 98.0 fL   MCH 27.2 27.0 - 34.0 pg   MCHC 33.0 32.0 - 36.0 g/dL   RDW 85.6 88.4 - 85.4 %   MPV 9.9 7.4 - 10.4 fL   Platelet 282 140 - 415 10*9/L   Neutrophils % 48.4 %   Lymphocytes % 28.8 %   Monocytes % 10.0 %   Eosinophils % 11.7 %   Basophils % 0.8 %   Absolute Neutrophils 3.2 1.8 - 7.8 10*9/L   Absolute Lymphocytes 1.9 0.7 - 4.5 10*9/L   Absolute Monocytes 0.7 0.1 - 1.0 10*9/L   Absolute Eosinophils 0.8 (H) 0.0 - 0.4 10*9/L   Absolute Basophils 0.1 0.0 - 0.2 10*9/L   CT head without contrast Result Date: 01/07/2024 Exam:  CT Head without Contrast  History:  Weakness, fall     Technique: Routine head CT without IV contrast.  AEC (automated exposure control) and/or manual techniques such as size-specific kV and mAs are employed where appropriate to reduce radiation exposure for all CT exams.  Comparison:  None.      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 There is associated thickening of the left maxillary sinus wall and the sinus is nearly completely opacified. 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 Frontal sinuses are hypoplastic. Mastoid air cells  are well aerated.  There is no intracranial hemorrhage or evidence of an acute infarction. Septum pellucidum anatomic variant, ventricles and cisterns are otherwise unremarkable. There is no intracranial mass or mass effect.     1.    No acute intracranial  abnormality.     2.    Chronic left maxillary sinusitis.    Signed (Electronic Signature): 01/07/2024 12:21 AM Signed By: Emeline KATHEE Milroy, MD  XR Foot 2 Views Bilateral Result Date: 01/07/2024 Exam:  Bilateral Feet  History:  Pain  Technique:  3 views of each foot  Comparison:   None.  Findings:  RIGHT FOOT:  No evidence of an acute fracture. No destructive bone lesions or erosive changes. There is no joint dislocation or acute malalignment. Generalized soft tissue swelling.  LEFT FOOT: No evidence of an acute fracture. No destructive bone lesions or erosive changes. There is no joint dislocation or acute malalignment. Generalized soft tissue swelling.      Generalized swelling in both lower extremities. No additional acute findings.    Signed (Electronic Signature): 01/07/2024 12:19 AM Signed By: Emeline KATHEE Milroy, MD  XR Pelvis 1 Or 2 Views Result Date: 01/07/2024 Exam:  Pelvis  History:  Lower extremity swelling, possible injury  Technique:  AP pelvis  Comparison:  None.  Findings:  There is no evidence of an acute fracture. No destructive bone lesions or erosive changes. There is no joint dislocation or acute malalignment. Marginal spurring and disc degeneration in the lower lumbar spine at L4-5. Superficial soft tissues are grossly unremarkable.     No acute findings.    Signed (Electronic Signature): 01/07/2024 12:17 AM Signed By: Emeline KATHEE Milroy, MD   Medications Administered:  Medications  amlodipine (NORVASC) tablet 10 mg (has no administration in time range)  thiamine (B-1) injection 100 mg (100 mg Intravenous Given 01/06/24 2341)  sodium chloride 0.9% (NS) bolus 500 mL (0 mL Intravenous Stopped 01/07/24 0041)    Discharge Medications (Medications Prescribed during this  ED visit and Patient's Home Medications) :    Your Medication List     ASK your doctor about these medications    amlodipine 5 MG tablet Commonly known as: NORVASC Take 5 mg by mouth daily.   furosemide 20 MG  tablet Commonly known as: LASIX Take 1 tablet (20 mg total) by mouth daily.   losartan 100 MG tablet Commonly known as: COZAAR Take 1 tablet (100 mg total) by mouth daily.          Cherie Ardeen Hanger, MD 01/07/24 [phone]

## 2024-03-13 ENCOUNTER — Encounter: Payer: Self-pay | Admitting: *Deleted

## 2024-03-13 ENCOUNTER — Encounter: Payer: Self-pay | Admitting: Cardiology

## 2024-03-13 ENCOUNTER — Ambulatory Visit: Attending: Cardiology | Admitting: Cardiology

## 2024-03-13 VITALS — BP 180/85 | HR 77 | Ht 71.0 in | Wt 150.2 lb

## 2024-03-13 DIAGNOSIS — I10 Essential (primary) hypertension: Secondary | ICD-10-CM | POA: Diagnosis not present

## 2024-03-13 DIAGNOSIS — Z136 Encounter for screening for cardiovascular disorders: Secondary | ICD-10-CM | POA: Diagnosis not present

## 2024-03-13 DIAGNOSIS — R600 Localized edema: Secondary | ICD-10-CM | POA: Diagnosis not present

## 2024-03-13 MED ORDER — FUROSEMIDE 40 MG PO TABS: 40.0000 mg | ORAL_TABLET | ORAL | 2 refills | Status: AC | PRN

## 2024-03-13 NOTE — Progress Notes (Signed)
      Clinical Summary Mr. Dosher is a 63 y.o.male seen today as a new consult, referred by PA Skillman for the following medical problems.    Leg swelling - symptoms started in May - bilateral LE edema - no SOB/DOE, no orthopnea - norvasc was stopped  12/2023 US : no right sided DVT - reprots swelling better over the last 2 weeks.  - was taking lasix, did not seem to help.  - limiting sodium intake    2.HTN - compliaint with meds, but have not taken yet today - recent pcp visit 122/72   3. EtOH - drinks 4-5 days. Brandy shots x 3-4, 1-2 beer per day.    Past Medical History:  Diagnosis Date   Alcohol use    Anemia    Cervical radiculopathy    Edema    Elevated liver function tests    Hyperlipidemia    Hypertension    Left shoulder pain    Neck pain    Tobacco use      No Known Allergies   Current Outpatient Medications  Medication Sig Dispense Refill   losartan (COZAAR) 100 MG tablet Take 100 mg by mouth daily.     folic acid (FOLVITE) 1 MG tablet Take 1 mg by mouth daily. (Patient not taking: Reported on 03/13/2024)     furosemide (LASIX) 20 MG tablet Take 20 mg by mouth daily. (Patient not taking: Reported on 03/13/2024)     No current facility-administered medications for this visit.        No Known Allergies    No family history on file.   Social History Mr. Cossin reports that he has been smoking cigars. He has never used smokeless tobacco. Mr. Zielinski reports current alcohol use.     Physical Examination Today's Vitals   03/13/24 0855 03/13/24 0900 03/13/24 0929  BP: (!) 184/104 (!) 182/100 (!) 180/85  Pulse: 77    SpO2: 99%    Weight: 150 lb 3.2 oz (68.1 kg)    Height: 5' 11 (1.803 m)     Body mass index is 20.95 kg/m.  Gen: resting comfortably, no acute distress HEENT: no scleral icterus, pupils equal round and reactive, no palptable cervical adenopathy,  CV: RRR, no m/rg, no jvd Resp: Clear to auscultation  bilaterally GI: abdomen is soft, non-tender, non-distended, normal bowel sounds, no hepatosplenomegaly MSK: extremities are warm, no edema.  Skin: warm, no rash Neuro:  no focal deficits Psych: appropriate affect    Assessment and Plan  1.Leg edema - unclear etiology, given history of EtoH and HTN risk for possibly cardiomyopathy as etiology - plan for echo - start lasix 40mg  prn swelling - EKG today shows NSR  2. HTN - elevated here, was well controlled at pcp visit 3 months ago - has not had meds yet today - bp recheck at time of echo        Dorn PHEBE Ross, M.D.

## 2024-03-13 NOTE — Patient Instructions (Signed)
 Medication Instructions:   Change your Lasix to 40mg  as needed for severe swelling  Continue all other medications.     Labwork:  none  Testing/Procedures:  Your physician has requested that you have an echocardiogram. Echocardiography is a painless test that uses sound waves to create images of your heart. It provides your doctor with information about the size and shape of your heart and how well your heart's chambers and valves are working. This procedure takes approximately one hour. There are no restrictions for this procedure. Please do NOT wear cologne, perfume, aftershave, or lotions (deodorant is allowed). Please arrive 15 minutes prior to your appointment time.  Please note: We ask at that you not bring children with you during ultrasound (echo/ vascular) testing. Due to room size and safety concerns, children are not allowed in the ultrasound rooms during exams. Our front office staff cannot provide observation of children in our lobby area while testing is being conducted. An adult accompanying a patient to their appointment will only be allowed in the ultrasound room at the discretion of the ultrasound technician under special circumstances. We apologize for any inconvenience. Office will contact with results via phone, letter or mychart.     Follow-Up:  6 weeks   Any Other Special Instructions Will Be Listed Below (If Applicable).  Nurse visit for BP check when he comes in for his Echo   If you need a refill on your cardiac medications before your next appointment, please call your pharmacy.

## 2024-03-28 ENCOUNTER — Ambulatory Visit: Payer: Self-pay | Admitting: Cardiology

## 2024-04-01 ENCOUNTER — Ambulatory Visit: Attending: Cardiology

## 2024-04-01 ENCOUNTER — Ambulatory Visit

## 2024-04-01 DIAGNOSIS — R600 Localized edema: Secondary | ICD-10-CM | POA: Diagnosis not present

## 2024-04-02 LAB — ECHOCARDIOGRAM COMPLETE
AR max vel: 3.62 cm2
AV Area VTI: 3.8 cm2
AV Area mean vel: 3.67 cm2
AV Mean grad: 3.2 mmHg
AV Peak grad: 6.8 mmHg
Ao pk vel: 1.3 m/s
Area-P 1/2: 2.4 cm2
Calc EF: 64.8 %
MV M vel: 5.35 m/s
MV Peak grad: 114.5 mmHg
S' Lateral: 3.2 cm
Single Plane A2C EF: 71.3 %
Single Plane A4C EF: 59.2 %

## 2024-05-16 ENCOUNTER — Ambulatory Visit: Admitting: Nurse Practitioner

## 2024-05-24 ENCOUNTER — Ambulatory Visit: Payer: Self-pay | Admitting: Cardiology

## 2024-05-28 ENCOUNTER — Encounter: Payer: Self-pay | Admitting: *Deleted

## 2024-07-31 ENCOUNTER — Ambulatory Visit: Admitting: Nurse Practitioner
